# Patient Record
Sex: Male | Born: 1970
Health system: Southern US, Community
[De-identification: ages and names within clinical notes are randomized; demographics above are authoritative.]

## PROBLEM LIST (undated history)

## (undated) DIAGNOSIS — M109 Gout, unspecified: Secondary | ICD-10-CM

## (undated) DIAGNOSIS — G4730 Sleep apnea, unspecified: Secondary | ICD-10-CM

## (undated) DIAGNOSIS — Z9889 Other specified postprocedural states: Secondary | ICD-10-CM

## (undated) DIAGNOSIS — R112 Nausea with vomiting, unspecified: Secondary | ICD-10-CM

## (undated) HISTORY — PX: KNEE SURGERY: SHX244

## (undated) HISTORY — PX: TONSILLECTOMY: SUR1361

## (undated) HISTORY — PX: EYE SURGERY: SHX253

---

## 2001-09-26 ENCOUNTER — Emergency Department (HOSPITAL_COMMUNITY): Admission: EM | Admit: 2001-09-26 | Discharge: 2001-09-26 | Payer: Self-pay | Admitting: Emergency Medicine

## 2001-09-26 ENCOUNTER — Encounter: Payer: Self-pay | Admitting: Emergency Medicine

## 2015-06-09 ENCOUNTER — Ambulatory Visit: Payer: Self-pay | Admitting: General Surgery

## 2015-06-28 ENCOUNTER — Encounter (HOSPITAL_COMMUNITY): Payer: Self-pay | Admitting: Emergency Medicine

## 2015-06-28 DIAGNOSIS — K8050 Calculus of bile duct without cholangitis or cholecystitis without obstruction: Secondary | ICD-10-CM | POA: Insufficient documentation

## 2015-06-28 DIAGNOSIS — R630 Anorexia: Secondary | ICD-10-CM | POA: Insufficient documentation

## 2015-06-28 DIAGNOSIS — R1013 Epigastric pain: Secondary | ICD-10-CM | POA: Diagnosis present

## 2015-06-28 NOTE — ED Notes (Signed)
Pt. reports epigastric pain with nausea onset last night , denies emesis or diarrhea . No fever or chills. Pt. stated history of gallstones.

## 2015-06-29 ENCOUNTER — Emergency Department (HOSPITAL_COMMUNITY): Payer: BLUE CROSS/BLUE SHIELD

## 2015-06-29 ENCOUNTER — Emergency Department (HOSPITAL_COMMUNITY)
Admission: EM | Admit: 2015-06-29 | Discharge: 2015-06-29 | Disposition: A | Discharge status: Home / Self Care | Payer: BLUE CROSS/BLUE SHIELD | Attending: Emergency Medicine | Admitting: Emergency Medicine

## 2015-06-29 DIAGNOSIS — K8050 Calculus of bile duct without cholangitis or cholecystitis without obstruction: Secondary | ICD-10-CM

## 2015-06-29 LAB — COMPREHENSIVE METABOLIC PANEL
ALT: 19 U/L (ref 17–63)
AST: 23 U/L (ref 15–41)
Albumin: 4 g/dL (ref 3.5–5.0)
Alkaline Phosphatase: 58 U/L (ref 38–126)
Anion gap: 10 (ref 5–15)
BUN: 15 mg/dL (ref 6–20)
CO2: 23 mmol/L (ref 22–32)
Calcium: 9.2 mg/dL (ref 8.9–10.3)
Chloride: 104 mmol/L (ref 101–111)
Creatinine, Ser: 1.06 mg/dL (ref 0.61–1.24)
GFR calc Af Amer: >60 mL/min (ref >60)
GFR calc non Af Amer: >60 mL/min (ref >60)
Glucose, Bld: 111 mg/dL — ABNORMAL HIGH (ref 65–99)
Potassium: 4.2 mmol/L (ref 3.5–5.1)
Sodium: 137 mmol/L (ref 135–145)
Total Bilirubin: 0.9 mg/dL (ref 0.3–1.2)
Total Protein: 7.2 g/dL (ref 6.5–8.1)

## 2015-06-29 LAB — URINALYSIS, ROUTINE W REFLEX MICROSCOPIC
Bilirubin Urine: NEGATIVE
Glucose, UA: NEGATIVE mg/dL
Hgb urine dipstick: NEGATIVE
Ketones, ur: NEGATIVE mg/dL
Leukocytes, UA: NEGATIVE
Nitrite: NEGATIVE
Protein, ur: NEGATIVE mg/dL
Specific Gravity, Urine: 1.026 (ref 1.005–1.030)
Urobilinogen, UA: 1 mg/dL (ref 0.0–1.0)
pH: 5.5 (ref 5.0–8.0)

## 2015-06-29 LAB — CBC
HCT: 41.7 % (ref 39.0–52.0)
Hemoglobin: 14.5 g/dL (ref 13.0–17.0)
MCH: 31.3 pg (ref 26.0–34.0)
MCHC: 34.8 g/dL (ref 30.0–36.0)
MCV: 90.1 fL (ref 78.0–100.0)
Platelets: 201 10*3/uL (ref 150–400)
RBC: 4.63 MIL/uL (ref 4.22–5.81)
RDW: 12.4 % (ref 11.5–15.5)
WBC: 10.4 10*3/uL (ref 4.0–10.5)

## 2015-06-29 LAB — LIPASE, BLOOD: Lipase: 24 U/L (ref 22–51)

## 2015-06-29 MED ORDER — ONDANSETRON 4 MG PO TBDP: ORAL_TABLET | ORAL | Status: DC

## 2015-06-29 MED ORDER — ONDANSETRON HCL 4 MG/2ML IJ SOLN
4.0000 mg | Freq: Once | INTRAMUSCULAR | Status: DC
  Filled 2015-06-29: qty 2

## 2015-06-29 MED ORDER — HYDROMORPHONE HCL 1 MG/ML IJ SOLN: 1.0000 mg | Freq: Once | INTRAMUSCULAR | Status: DC

## 2015-06-29 NOTE — ED Notes (Signed)
The pt is scheduled for gb surgery

## 2015-06-29 NOTE — Discharge Instructions (Signed)
Biliary Colic  °Biliary colic is a steady or irregular pain in the upper abdomen. It is usually under the right side of the rib cage. It happens when gallstones interfere with the normal flow of bile from the gallbladder. Bile is a liquid that helps to digest fats. Bile is made in the liver and stored in the gallbladder. When you eat a meal, bile passes from the gallbladder through the cystic duct and the common bile duct into the small intestine. There, it mixes with partially digested food. If a gallstone blocks either of these ducts, the normal flow of bile is blocked. The muscle cells in the bile duct contract forcefully to try to move the stone. This causes the pain of biliary colic.  °SYMPTOMS  °· A person with biliary colic usually complains of pain in the upper abdomen. This pain can be: °¨ In the center of the upper abdomen just below the breastbone. °¨ In the upper-right part of the abdomen, near the gallbladder and liver. °¨ Spread back toward the right shoulder blade. °· Nausea and vomiting. °· The pain usually occurs after eating. °· Biliary colic is usually triggered by the digestive system's demand for bile. The demand for bile is high after fatty meals. Symptoms can also occur when a person who has been fasting suddenly eats a very large meal. Most episodes of biliary colic pass after 1 to 5 hours. After the most intense pain passes, your abdomen may continue to ache mildly for about 24 hours. °DIAGNOSIS  °After you describe your symptoms, your caregiver will perform a physical exam. He or she will pay attention to the upper right portion of your belly (abdomen). This is the area of your liver and gallbladder. An ultrasound will help your caregiver look for gallstones. Specialized scans of the gallbladder may also be done. Blood tests may be done, especially if you have fever or if your pain persists. °PREVENTION  °Biliary colic can be prevented by controlling the risk factors for gallstones. Some of  these risk factors, such as heredity, increasing age, and pregnancy are a normal part of life. Obesity and a high-fat diet are risk factors you can change through a healthy lifestyle. Women going through menopause who take hormone replacement therapy (estrogen) are also more likely to develop biliary colic. °TREATMENT  °· Pain medication may be prescribed. °· You may be encouraged to eat a fat-free diet. °· If the first episode of biliary colic is severe, or episodes of colic keep retuning, surgery to remove the gallbladder (cholecystectomy) is usually recommended. This procedure can be done through small incisions using an instrument called a laparoscope. The procedure often requires a brief stay in the hospital. Some people can leave the hospital the same day. It is the most widely used treatment in people troubled by painful gallstones. It is effective and safe, with no complications in more than 90% of cases. °· If surgery cannot be done, medication that dissolves gallstones may be used. This medication is expensive and can take months or years to work. Only small stones will dissolve. °· Rarely, medication to dissolve gallstones is combined with a procedure called shock-wave lithotripsy. This procedure uses carefully aimed shock waves to break up gallstones. In many people treated with this procedure, gallstones form again within a few years. °PROGNOSIS  °If gallstones block your cystic duct or common bile duct, you are at risk for repeated episodes of biliary colic. There is also a 25% chance that you will develop   a gallbladder infection(acute cholecystitis), or some other complication of gallstones within 10 to 20 years. If you have surgery, schedule it at a time that is convenient for you and at a time when you are not sick. °HOME CARE INSTRUCTIONS  °· Drink plenty of clear fluids. °· Avoid fatty, greasy or fried foods, or any foods that make your pain worse. °· Take medications as directed. °SEEK MEDICAL  CARE IF:  °· You develop a fever over 100.5° F (38.1° C). °· Your pain gets worse over time. °· You develop nausea that prevents you from eating and drinking. °· You develop vomiting. °SEEK IMMEDIATE MEDICAL CARE IF:  °· You have continuous or severe belly (abdominal) pain which is not relieved with medications. °· You develop nausea and vomiting which is not relieved with medications. °· You have symptoms of biliary colic and you suddenly develop a fever and shaking chills. This may signal cholecystitis. Call your caregiver immediately. °· You develop a yellow color to your skin or the white part of your eyes (jaundice). °Document Released: 04/17/2006 Document Revised: 02/06/2012 Document Reviewed: 06/26/2008 °Exit[employer]® Patient Information ©2015 [employer]. This information is not intended to replace advice given to you by your health care provider. Make sure you discuss any questions you have with your health care provider. ° °

## 2015-06-29 NOTE — ED Notes (Signed)
abd pain with nv.  Has gallstones

## 2015-06-29 NOTE — ED Notes (Signed)
To ct

## 2015-06-29 NOTE — ED Provider Notes (Signed)
CSN: [id number]     Arrival date & time 06/28/15  2339 History  This chart was scribed for Mirian Mo, MD by Doreatha Martin, ED Scribe. This patient was seen in room A13C/A13C and the patient's care was started at 1:23 AM.     Chief Complaint  Patient presents with  . Abdominal Pain   Patient is a 44 y.o. male presenting with abdominal pain. The history is provided by the patient. No language interpreter was used.  Abdominal Pain Pain location:  Epigastric Pain radiates to:  Does not radiate Pain severity:  Moderate Duration:  22 hours Timing:  Constant Chronicity:  Recurrent Relieved by:  OTC medications Worsened by:  Movement Associated symptoms: nausea   Associated symptoms: no chills, no constipation, no diarrhea, no fever and no vomiting     HPI Comments: Gabriel Carlson is a 44 y.o. male who presents to the Emergency Department complaining of moderate epigastric abdominal pain onset at 0300 today. Pt reports associated nausea. Per pt, pain was slightly relieved by lying flat. He states that he has not eaten today. He states similar pain on June 19 and the episode lasted for 4-6 hours with 50-100 episodes of vomiting. He went to PCP for testing and was diagnosed with gallstones. The wife notes that they have come tonight because the surgeon they saw stated that his case was non-emergent and would not remove his gallbladder at that time. Pt took pain medication 8 hours ago with moderate relief. He denies vomiting, fever, chills, constipation and diarrhea.   History reviewed. No pertinent past medical history. Past Surgical History  Procedure Laterality Date  . Eye surgery    . Tonsillectomy    . Knee surgery     No family history on file. History  Substance Use Topics  . Smoking status: Never Smoker   . Smokeless tobacco: Not on file  . Alcohol Use: No    Review of Systems  Constitutional: Negative for fever and chills.  Gastrointestinal: Positive for nausea and abdominal  pain. Negative for vomiting, diarrhea and constipation.  All other systems reviewed and are negative.  Allergies  Review of patient's allergies indicates no known allergies.  Home Medications   Prior to Admission medications   Medication Sig Start Date End Date Taking? Authorizing Provider  ondansetron (ZOFRAN ODT) 4 MG disintegrating tablet  ODT q4 hours prn nausea/vomit 06/29/15   Mirian Mo, MD   BP 120/58 mmHg  Pulse 71  Temp(Src) 97.7 F (36.5 C) (Oral)  Resp 14  Ht 6' (1.829 m)  Wt 240 lb (108.863 kg)  BMI 32.54 kg/m2  SpO2 100% Physical Exam  Constitutional: He is oriented to person, place, and time. He appears well-developed and well-nourished.  HENT:  Head: Normocephalic and atraumatic.  Eyes: Conjunctivae and EOM are normal.  Neck: Normal range of motion. Neck supple.  Cardiovascular: Normal rate, regular rhythm and normal heart sounds.   Pulmonary/Chest: Effort normal and breath sounds normal. No respiratory distress.  Abdominal: He exhibits no distension. There is tenderness in the right upper quadrant and epigastric area. There is no rebound and no guarding.  Musculoskeletal: Normal range of motion.  Neurological: He is alert and oriented to person, place, and time.  Skin: Skin is warm and dry.  Vitals reviewed.   ED Course  Procedures (including critical care time) DIAGNOSTIC STUDIES: Oxygen Saturation is 100% on RA, normal by my interpretation.    COORDINATION OF CARE: 1:32 AM Discussed treatment plan with pt at  bedside and pt agreed to plan.   Labs Review Labs Reviewed  COMPREHENSIVE METABOLIC PANEL - Abnormal; Notable for the following:    Glucose, Bld 111 (*)    All other components within normal limits  LIPASE, BLOOD  CBC  URINALYSIS, ROUTINE W REFLEX MICROSCOPIC (NOT AT [facility])    Imaging Review No results found.   EKG Interpretation None      MDM   Final diagnoses:  Biliary colic    44 y.o. male with pertinent PMH of biliary  colic with prior planned cholecystectomy (not currently scheduled) presents with recurrent abd pain.  Patient denies fever, altered mental status, other signs of cholangitis. On arrival his vital signs and physical exam as above. He has a negative Murphy sign on my exam, but does have mild RUQ tenderness. Workup obtained as above and demonstrated questionable cholecystitis.  I spoke with surgery to his reviewed the ultrasound, feel the pt stable for dc home as he has no fever, no leukocytosis, and is otherwise well.  I discussed the results with the pt and reviewed strict return precautions.  DC home to fu with surgery.    I have reviewed all laboratory and imaging studies if ordered as above  1. Biliary colic          Mirian Mo, MD 07/02/15 [phone]

## 2015-07-08 ENCOUNTER — Encounter (HOSPITAL_COMMUNITY): Payer: Self-pay

## 2015-07-08 ENCOUNTER — Inpatient Hospital Stay (HOSPITAL_COMMUNITY)
Admission: RE | Admit: 2015-07-08 | Discharge: 2015-07-08 | Disposition: A | Discharge status: Home / Self Care | Payer: BLUE CROSS/BLUE SHIELD | Source: Ambulatory Visit

## 2015-07-08 HISTORY — DX: Sleep apnea, unspecified: G47.30

## 2015-07-08 HISTORY — DX: Gout, unspecified: M10.9

## 2015-07-09 MED ORDER — CHLORHEXIDINE GLUCONATE 4 % EX LIQD: 1.0000 "application " | CUTANEOUS | Status: DC

## 2015-07-09 MED ORDER — DEXTROSE 5 % IV SOLN
2.0000 g | INTRAVENOUS | Status: AC
  Administered 2015-07-10: 2 g via INTRAVENOUS
  Filled 2015-07-09: qty 2

## 2015-07-10 ENCOUNTER — Ambulatory Visit (HOSPITAL_COMMUNITY): Payer: BLUE CROSS/BLUE SHIELD | Admitting: Anesthesiology

## 2015-07-10 ENCOUNTER — Ambulatory Visit (HOSPITAL_COMMUNITY)
Admission: RE | Admit: 2015-07-10 | Discharge: 2015-07-10 | Disposition: A | Discharge status: Home / Self Care | Payer: BLUE CROSS/BLUE SHIELD | Source: Ambulatory Visit | Attending: General Surgery | Admitting: General Surgery

## 2015-07-10 ENCOUNTER — Encounter (HOSPITAL_COMMUNITY)
Admission: RE | Disposition: A | Discharge status: Home / Self Care | Payer: Self-pay | Source: Ambulatory Visit | Attending: General Surgery

## 2015-07-10 ENCOUNTER — Encounter (HOSPITAL_COMMUNITY): Payer: Self-pay | Admitting: *Deleted

## 2015-07-10 DIAGNOSIS — K8020 Calculus of gallbladder without cholecystitis without obstruction: Secondary | ICD-10-CM | POA: Insufficient documentation

## 2015-07-10 DIAGNOSIS — G4730 Sleep apnea, unspecified: Secondary | ICD-10-CM | POA: Insufficient documentation

## 2015-07-10 HISTORY — PX: CHOLECYSTECTOMY: SHX55

## 2015-07-10 HISTORY — DX: Nausea with vomiting, unspecified: R11.2

## 2015-07-10 HISTORY — DX: Other specified postprocedural states: Z98.890

## 2015-07-10 SURGERY — LAPAROSCOPIC CHOLECYSTECTOMY WITH INTRAOPERATIVE CHOLANGIOGRAM
Anesthesia: General | Site: Abdomen

## 2015-07-10 MED ORDER — MIDAZOLAM HCL 2 MG/2ML IJ SOLN
INTRAMUSCULAR | Status: AC
  Filled 2015-07-10: qty 4

## 2015-07-10 MED ORDER — BUPIVACAINE-EPINEPHRINE (PF) 0.25% -1:200000 IJ SOLN
INTRAMUSCULAR | Status: AC
  Filled 2015-07-10: qty 30

## 2015-07-10 MED ORDER — NEOSTIGMINE METHYLSULFATE 10 MG/10ML IV SOLN
INTRAVENOUS | Status: DC | PRN
  Administered 2015-07-10: 3 mg via INTRAVENOUS

## 2015-07-10 MED ORDER — FENTANYL CITRATE (PF) 100 MCG/2ML IJ SOLN
INTRAMUSCULAR | Status: DC | PRN
  Administered 2015-07-10: 50 ug via INTRAVENOUS
  Administered 2015-07-10 (×2): 100 ug via INTRAVENOUS

## 2015-07-10 MED ORDER — SODIUM CHLORIDE 0.9 % IR SOLN
Status: DC | PRN
  Administered 2015-07-10: 1000 mL

## 2015-07-10 MED ORDER — LIDOCAINE HCL (CARDIAC) 20 MG/ML IV SOLN
INTRAVENOUS | Status: DC | PRN
  Administered 2015-07-10: 100 mg via INTRAVENOUS

## 2015-07-10 MED ORDER — HYDROMORPHONE HCL 1 MG/ML IJ SOLN
0.2500 mg | INTRAMUSCULAR | Status: DC | PRN
  Administered 2015-07-10: 0.5 mg via INTRAVENOUS

## 2015-07-10 MED ORDER — MIDAZOLAM HCL 5 MG/5ML IJ SOLN
INTRAMUSCULAR | Status: DC | PRN
  Administered 2015-07-10: 2 mg via INTRAVENOUS

## 2015-07-10 MED ORDER — GLYCOPYRROLATE 0.2 MG/ML IJ SOLN
INTRAMUSCULAR | Status: DC | PRN
  Administered 2015-07-10: 0.4 mg via INTRAVENOUS

## 2015-07-10 MED ORDER — SCOPOLAMINE 1 MG/3DAYS TD PT72
1.0000 | MEDICATED_PATCH | TRANSDERMAL | Status: DC
  Administered 2015-07-10: 1.5 mg via TRANSDERMAL
  Filled 2015-07-10: qty 1

## 2015-07-10 MED ORDER — SCOPOLAMINE 1 MG/3DAYS TD PT72: 1.0000 | MEDICATED_PATCH | Freq: Once | TRANSDERMAL | Status: DC

## 2015-07-10 MED ORDER — ONDANSETRON HCL 4 MG/2ML IJ SOLN
INTRAMUSCULAR | Status: DC | PRN
  Administered 2015-07-10: 4 mg via INTRAVENOUS

## 2015-07-10 MED ORDER — HYDROCODONE-ACETAMINOPHEN 5-325 MG PO TABS: 1.0000 | ORAL_TABLET | ORAL | Status: AC | PRN

## 2015-07-10 MED ORDER — FENTANYL CITRATE (PF) 250 MCG/5ML IJ SOLN
INTRAMUSCULAR | Status: AC
  Filled 2015-07-10: qty 5

## 2015-07-10 MED ORDER — PROMETHAZINE HCL 25 MG/ML IJ SOLN
6.2500 mg | INTRAMUSCULAR | Status: DC | PRN
  Administered 2015-07-10: 6.25 mg via INTRAVENOUS

## 2015-07-10 MED ORDER — HYDROMORPHONE HCL 1 MG/ML IJ SOLN
INTRAMUSCULAR | Status: AC
  Filled 2015-07-10: qty 1

## 2015-07-10 MED ORDER — 0.9 % SODIUM CHLORIDE (POUR BTL) OPTIME
TOPICAL | Status: DC | PRN
  Administered 2015-07-10: 1000 mL

## 2015-07-10 MED ORDER — PROPOFOL 10 MG/ML IV BOLUS
INTRAVENOUS | Status: AC
  Filled 2015-07-10: qty 20

## 2015-07-10 MED ORDER — METOCLOPRAMIDE HCL 5 MG/ML IJ SOLN
INTRAMUSCULAR | Status: AC
  Filled 2015-07-10: qty 2

## 2015-07-10 MED ORDER — ROCURONIUM BROMIDE 100 MG/10ML IV SOLN
INTRAVENOUS | Status: DC | PRN
  Administered 2015-07-10: 20 mg via INTRAVENOUS
  Administered 2015-07-10: 30 mg via INTRAVENOUS

## 2015-07-10 MED ORDER — ONDANSETRON HCL 4 MG/2ML IJ SOLN
INTRAMUSCULAR | Status: AC
Start: 2015-07-10 — End: 2015-07-10
  Administered 2015-07-10: 4 mg
  Filled 2015-07-10: qty 2

## 2015-07-10 MED ORDER — BUPIVACAINE-EPINEPHRINE 0.25% -1:200000 IJ SOLN
INTRAMUSCULAR | Status: DC | PRN
  Administered 2015-07-10: 15 mL

## 2015-07-10 MED ORDER — METOCLOPRAMIDE HCL 5 MG/ML IJ SOLN
INTRAMUSCULAR | Status: DC | PRN
  Administered 2015-07-10: 10 mg via INTRAVENOUS

## 2015-07-10 MED ORDER — LACTATED RINGERS IV SOLN
INTRAVENOUS | Status: DC
  Administered 2015-07-10 (×3): via INTRAVENOUS

## 2015-07-10 MED ORDER — SCOPOLAMINE 1 MG/3DAYS TD PT72: 1.0000 | MEDICATED_PATCH | TRANSDERMAL | Status: AC

## 2015-07-10 MED ORDER — PROPOFOL 10 MG/ML IV BOLUS
INTRAVENOUS | Status: DC | PRN
  Administered 2015-07-10: 150 mg via INTRAVENOUS

## 2015-07-10 MED ORDER — PROMETHAZINE HCL 25 MG/ML IJ SOLN
INTRAMUSCULAR | Status: AC
Start: 2015-07-10 — End: 2015-07-10
  Administered 2015-07-10: 6.25 mg via INTRAVENOUS
  Filled 2015-07-10: qty 1

## 2015-07-10 MED ORDER — SCOPOLAMINE 1 MG/3DAYS TD PT72
MEDICATED_PATCH | TRANSDERMAL | Status: AC
  Filled 2015-07-10: qty 1

## 2015-07-10 SURGICAL SUPPLY — 44 items
APPLIER CLIP 5 13 M/L LIGAMAX5 (MISCELLANEOUS) ×3
BLADE SURG ROTATE 9660 (MISCELLANEOUS) ×3 IMPLANT
CANISTER SUCTION 2500CC (MISCELLANEOUS) ×3 IMPLANT
CHLORAPREP W/TINT 26ML (MISCELLANEOUS) ×3 IMPLANT
CLIP APPLIE 5 13 M/L LIGAMAX5 (MISCELLANEOUS) ×1 IMPLANT
CLOSURE WOUND 1/2 X4 (GAUZE/BANDAGES/DRESSINGS) ×1
CONT SPEC 4OZ CLIKSEAL STRL BL (MISCELLANEOUS) ×3 IMPLANT
COVER MAYO STAND STRL (DRAPES) IMPLANT
COVER SURGICAL LIGHT HANDLE (MISCELLANEOUS) ×3 IMPLANT
DERMABOND ADVANCED (GAUZE/BANDAGES/DRESSINGS) ×2
DERMABOND ADVANCED .7 DNX12 (GAUZE/BANDAGES/DRESSINGS) ×1 IMPLANT
DRAPE C-ARM 42X72 X-RAY (DRAPES) IMPLANT
DRSG TEGADERM 2-3/8X2-3/4 SM (GAUZE/BANDAGES/DRESSINGS) ×3 IMPLANT
ELECT REM PT RETURN 9FT ADLT (ELECTROSURGICAL) ×3
ELECTRODE REM PT RTRN 9FT ADLT (ELECTROSURGICAL) ×1 IMPLANT
GLOVE BIO SURGEON STRL SZ 6.5 (GLOVE) ×2 IMPLANT
GLOVE BIO SURGEONS STRL SZ 6.5 (GLOVE) ×1
GLOVE BIOGEL PI IND STRL 7.0 (GLOVE) ×1 IMPLANT
GLOVE BIOGEL PI IND STRL 8 (GLOVE) ×1 IMPLANT
GLOVE BIOGEL PI INDICATOR 7.0 (GLOVE) ×2
GLOVE BIOGEL PI INDICATOR 8 (GLOVE) ×2
GLOVE ECLIPSE 7.5 STRL STRAW (GLOVE) ×3 IMPLANT
GOWN STRL REUS W/ TWL LRG LVL3 (GOWN DISPOSABLE) ×2 IMPLANT
GOWN STRL REUS W/TWL 2XL LVL3 (GOWN DISPOSABLE) ×3 IMPLANT
GOWN STRL REUS W/TWL LRG LVL3 (GOWN DISPOSABLE) ×4
KIT BASIN OR (CUSTOM PROCEDURE TRAY) ×3 IMPLANT
KIT ROOM TURNOVER OR (KITS) ×3 IMPLANT
NS IRRIG 1000ML POUR BTL (IV SOLUTION) ×3 IMPLANT
PAD ARMBOARD 7.5X6 YLW CONV (MISCELLANEOUS) ×3 IMPLANT
POUCH SPECIMEN RETRIEVAL 10MM (ENDOMECHANICALS) ×3 IMPLANT
SCISSORS LAP 5X35 DISP (ENDOMECHANICALS) ×3 IMPLANT
SET CHOLANGIOGRAPH 5 50 .035 (SET/KITS/TRAYS/PACK) IMPLANT
SET IRRIG TUBING LAPAROSCOPIC (IRRIGATION / IRRIGATOR) ×3 IMPLANT
SLEEVE ENDOPATH XCEL 5M (ENDOMECHANICALS) ×6 IMPLANT
SPECIMEN JAR SMALL (MISCELLANEOUS) IMPLANT
STRIP CLOSURE SKIN 1/2X4 (GAUZE/BANDAGES/DRESSINGS) ×2 IMPLANT
SUT MNCRL AB 4-0 PS2 18 (SUTURE) ×6 IMPLANT
SUT VICRYL 0 UR6 27IN ABS (SUTURE) ×3 IMPLANT
TOWEL OR 17X24 6PK STRL BLUE (TOWEL DISPOSABLE) ×3 IMPLANT
TOWEL OR 17X26 10 PK STRL BLUE (TOWEL DISPOSABLE) ×3 IMPLANT
TRAY LAPAROSCOPIC MC (CUSTOM PROCEDURE TRAY) ×3 IMPLANT
TROCAR XCEL BLUNT TIP 100MML (ENDOMECHANICALS) ×3 IMPLANT
TROCAR XCEL NON-BLD 5MMX100MML (ENDOMECHANICALS) ×3 IMPLANT
TUBING INSUFFLATION (TUBING) ×3 IMPLANT

## 2015-07-10 NOTE — Anesthesia Postprocedure Evaluation (Signed)
  Anesthesia Post-op Note  Patient: Gabriel Carlson  Procedure(s) Performed: Procedure(s): LAPAROSCOPIC CHOLECYSTECTOMY  (N/A)  Patient Location: PACU  Anesthesia Type:General  Level of Consciousness: awake, alert , oriented and patient cooperative  Airway and Oxygen Therapy: Patient Spontanous Breathing  Post-op Pain: mild  Post-op Assessment: Post-op Vital signs reviewed, Patient's Cardiovascular Status Stable, Respiratory Function Stable, Patent Airway, No signs of Nausea or vomiting and Pain level controlled              Post-op Vital Signs: Reviewed and stable  Last Vitals:  Filed Vitals:   07/10/15 1725  BP: 137/82  Pulse:   Temp: 36.8 C  Resp: 16    Complications: No apparent anesthesia complications

## 2015-07-10 NOTE — Transfer of Care (Signed)
Immediate Anesthesia Transfer of Care Note  Patient: Gabriel Carlson  Procedure(s) Performed: Procedure(s): LAPAROSCOPIC CHOLECYSTECTOMY  (N/A)  Patient Location: PACU  Anesthesia Type:General  Level of Consciousness: alert   Airway & Oxygen Therapy: Patient Spontanous Breathing and Patient connected to nasal cannula oxygen  Post-op Assessment: Report given to RN and Post -op Vital signs reviewed and stable  Post vital signs: Reviewed and stable  Last Vitals:  Filed Vitals:   07/10/15 1725  BP: 137/82  Pulse: 74  Temp: 36.8 C  Resp: 16    Complications: No apparent anesthesia complications

## 2015-07-10 NOTE — Anesthesia Preprocedure Evaluation (Addendum)
Anesthesia Evaluation  Patient identified by MRN, date of birth, ID band Patient awake    Reviewed: Allergy & Precautions, NPO status , Patient's Chart, lab work & pertinent test results  History of Anesthesia Complications (+) PONV and history of anesthetic complications  Airway Mallampati: II  TM Distance: >3 FB Neck ROM: Full    Dental  (+) Teeth Intact, Dental Advisory Given   Pulmonary sleep apnea ,  breath sounds clear to auscultation  Pulmonary exam normal       Cardiovascular Exercise Tolerance: Good negative cardio ROS Normal cardiovascular examRhythm:Regular Rate:Normal     Neuro/Psych negative neurological ROS  negative psych ROS   GI/Hepatic negative GI ROS, Neg liver ROS,   Endo/Other  obesity  Renal/GU negative Renal ROS     Musculoskeletal negative musculoskeletal ROS (+)   Abdominal   Peds  Hematology negative hematology ROS (+)   Anesthesia Other Findings Day of surgery medications reviewed with the patient.  Reproductive/Obstetrics                            Anesthesia Physical Anesthesia Plan  ASA: II  Anesthesia Plan: General   Post-op Pain Management:    Induction: Intravenous  Airway Management Planned: Oral ETT  Additional Equipment:   Intra-op Plan:   Post-operative Plan: Extubation in OR  Informed Consent: I have reviewed the patients History and Physical, chart, labs and discussed the procedure including the risks, benefits and alternatives for the proposed anesthesia with the patient or authorized representative who has indicated his/her understanding and acceptance.   Dental advisory given  Plan Discussed with: CRNA  Anesthesia Plan Comments: (Risks/benefits of general anesthesia discussed with patient including risk of damage to teeth, lips, gum, and tongue, nausea/vomiting, allergic reactions to medications, and the possibility of heart  attack, stroke and death.  All patient questions answered.  Patient wishes to proceed.)        Anesthesia Quick Evaluation

## 2015-07-10 NOTE — H&P (Signed)
Gabriel Carlson 06/09/2015 9:14 AM Location: [facility] Patient #: [id number] DOB: 1971-02-17 Married / Language: English / Race: White Male  History of Present Illness Gabriel Carlson CMA; 06/09/2015 9:14 AM) Patient words: gallbladder.  The patient is a 44 year old male    Other Problems Gabriel Carlson, [place]; 06/09/2015 9:14 AM) Arthritis Back Pain Chest pain Cholelithiasis Gastroesophageal Reflux Disease Kidney Stone Sleep Apnea  Past Surgical History Gabriel Carlson, CMA; 06/09/2015 9:14 AM) Knee Surgery Left. Tonsillectomy  Diagnostic Studies History Gabriel Carlson, [place]; 06/09/2015 9:14 AM) Colonoscopy never  Allergies Gabriel Carlson, [place]; 06/09/2015 9:14 AM) No Known Drug Allergies07/10/2015  Medication History Gabriel Carlson, [place]; 06/09/2015 9:14 AM) No Current Medications  Social History Gabriel Carlson, [place]; 06/09/2015 9:14 AM) No alcohol use No caffeine use No drug use Tobacco use Never smoker.  Family History Gabriel Carlson, [place]; 06/09/2015 9:14 AM) Arthritis Father. Breast Cancer Mother. Heart disease in male family member before age 55 Heart disease in male family member before age 76 Hypertension Father. Migraine Headache Mother. Seizure disorder Sister.  Review of Systems [facility] R. Carlson CMA; 06/09/2015 9:14 AM) General Not Present- Appetite Loss, Chills, Fatigue, Fever, Night Sweats, Weight Gain and Weight Loss. Skin Present- Rash. Not Present- Change in Wart/Mole, Dryness, Hives, Jaundice, New Lesions, Non-Healing Wounds and Ulcer. HEENT Present- Wears glasses/contact lenses. Not Present- Earache, Hearing Loss, Hoarseness, Nose Bleed, Oral Ulcers, Ringing in the Ears, Seasonal Allergies, Sinus Pain, Sore Throat, Visual Disturbances and Yellow Eyes. Respiratory Not Present- Bloody sputum, Chronic Cough, Difficulty Breathing, Snoring and Wheezing. Breast Not Present- Breast Mass, Breast  Pain, Nipple Discharge and Skin Changes. Cardiovascular Not Present- Chest Pain, Difficulty Breathing Lying Down, Leg Cramps, Palpitations, Rapid Heart Rate, Shortness of Breath and Swelling of Extremities. Gastrointestinal Not Present- Abdominal Pain, Bloating, Bloody Stool, Change in Bowel Habits, Chronic diarrhea, Constipation, Difficulty Swallowing, Excessive gas, Gets full quickly at meals, Hemorrhoids, Indigestion, Nausea, Rectal Pain and Vomiting. Male Genitourinary Not Present- Blood in Urine, Change in Urinary Stream, Frequency, Impotence, Nocturia, Painful Urination, Urgency and Urine Leakage. Musculoskeletal Present- Back Pain and Joint Pain. Not Present- Joint Stiffness, Muscle Pain, Muscle Weakness and Swelling of Extremities. Neurological Not Present- Decreased Memory, Fainting, Headaches, Numbness, Seizures, Tingling, Tremor, Trouble walking and Weakness. Psychiatric Not Present- Anxiety, Bipolar, Change in Sleep Pattern, Depression, Fearful and Frequent crying. Endocrine Not Present- Cold Intolerance, Excessive Hunger, Hair Changes, Heat Intolerance, Hot flashes and New Diabetes. Hematology Not Present- Easy Bruising, Excessive bleeding, Gland problems, HIV and Persistent Infections.   Vitals [employer] R. Carlson CMA; 06/09/2015 9:14 AM) 06/09/2015 9:14 AM Weight: 237.5 lb Height: 72in Body Surface Area: 2.34 m Body Mass Index: 32.21 kg/m BP: 130/80 (Sitting, Left Arm, Standard)    Physical Exam (Kyanne Rials O. Lindie Spruce MD; 06/09/2015 10:09 AM) General Mental Status-Alert. General Appearance-Cooperative and Well groomed. Orientation-Oriented X4. Build & Nutrition-Muscular and Well developed.  Chest and Lung Exam Chest and lung exam reveals -normal excursion with symmetric chest walls, quiet, even and easy respiratory effort with no use of accessory muscles, non-tender and normal tactile fremitus and on auscultation, normal breath sounds, no adventitious sounds and  normal vocal resonance.  Cardiovascular Cardiovascular examination reveals -on palpation PMI is normal in location and amplitude, no palpable S3 or S4. Normal cardiac borders., normal heart sounds, regular rate and rhythm with no murmurs and (see Vital Signs section for blood pressure measurements).  Abdomen Inspection Inspection of the abdomen reveals - No Visible peristalsis.  Palpation/Percussion Palpation and Percussion of the abdomen reveal - Soft, Non Tender, No Rebound tenderness and No Rigidity (guarding). Note: Hairy abdomen. Auscultation Auscultation of the abdomen reveals - Bowel sounds normal.    Assessment & Plan [name] O. Lindie Spruce MD; 06/09/2015 10:11 AM) SYMPTOMATIC CHOLELITHIASIS (574.20  K80.20) Impression: First attack in June, Ultrasoudn demonstrates cholelithiasis. LFTs normal. Patient wants surgery in August sometime. Current Plans:  Lap chole with possible IOC.  Marta Lamas. Gae Bon, MD, FACS [phone] [facility]

## 2015-07-10 NOTE — Anesthesia Procedure Notes (Signed)
Procedure Name: Intubation Date/Time: 07/10/2015 4:04 PM Performed by: Gavin Pound, Inetta Dicke J Pre-anesthesia Checklist: Timeout performed, Patient identified, Emergency Drugs available, Suction available and Patient being monitored Patient Re-evaluated:Patient Re-evaluated prior to inductionOxygen Delivery Method: Circle system utilized Preoxygenation: Pre-oxygenation with 100% oxygen Intubation Type: IV induction Ventilation: Mask ventilation without difficulty Laryngoscope Size: Mac and 4 Grade View: Grade I Tube type: Oral Tube size: 7.5 mm Number of attempts: 1 Placement Confirmation: ETT inserted through vocal cords under direct vision and breath sounds checked- equal and bilateral Secured at: 22 cm Tube secured with: Tape Dental Injury: Teeth and Oropharynx as per pre-operative assessment

## 2015-07-10 NOTE — Op Note (Signed)
OPERATIVE REPORT  DATE OF OPERATION: 07/10/2015  PATIENT:  Gabriel Carlson  44 y.o. male  PRE-OPERATIVE DIAGNOSIS:  Symptomatic cholelithiasis  POST-OPERATIVE DIAGNOSIS:  Symptomatic cholelithiasis  PROCEDURE:  Procedure(s): LAPAROSCOPIC CHOLECYSTECTOMY   SURGEON:  Surgeon(s): Jimmye Norman, MD Romie Levee, MD  ASSISTANT: Romie Levee, MD  ANESTHESIA:   general  EBL: <20 ml  BLOOD ADMINISTERED: none  DRAINS: none   SPECIMEN:  Source of Specimen:  Gallbladder and contents  COUNTS CORRECT:  YES  PROCEDURE DETAILS: The patient was taken to the operating room and placed on the table in the supine position.  After an adequate endotracheal anesthetic was administered, the patient was prepped with ChloroPrep, and then draped in the usual manner exposing the entire abdomen laterally, inferiorly and up  to the costal margins.  After a proper timeout was performed including identifying the patient and the procedure to be performed, a supraumbilical 1.5cm midline incision was made using a #15 blade.  This was taken down to the fascia which was then incised with a #15 blade.  The edges of the fascia were tented up with Kocher clamps as the preperitoneal space was penetrated with a Kelly clamp into the peritoneum.  Once this was done, a pursestring suture of 0 Vicryl was passed around the fascial opening.  This was subsequently used to secure the [facility] cannula which was passed into the peritoneal cavity.  Once the [facility] cannula was in place, carbon dioxide gas was insufflated into the peritoneal cavity up to a maximal intra-abdominal pressure of 15mm Hg.The laparoscope, with attached camera and light source, was passed into the peritoneal cavity to visualize the direct insertion of two right upper quadrant 5mm cannulas, and a sup-xiphoid 5mm cannula.  Once all cannulas were in place, the dissection was begun.  Two ratcheted graspers were attached to the dome and infundibulum of the  gallbladder and retracted towards the anterior abdominal wall and the right upper quadrant.  Using cautery attached to a dissecting forceps, the peritoneum overlaying the triangle of Chalot and the hepatoduodenal triangle was dissected away exposing the cystic duct and the cystic artery.  A critical window was developed between the CBD and the cystic duct The cystic artery was clipped proximally and distally then transected.  A clip was placed on the gallbladder side of the cystic duct, then the distal cystic duct was clipped multiple times then transected between the clips.  The gallbladder was then dissected out of the hepatic bed without event.  It was retrieved from the abdomen (using an EndoCatch bag) without event.  Once the gallbladder was removed, the bed was inspected for hemostasis.  Once excellent hemostasis was obtained all gas and fluids were aspirated from above the liver, then the cannulas were removed.  The supraumbilical incision was closed using the pursestring suture which was in place.  0.25% bupivicaine with epinephrine was injected at all sites.  All 10mm or greater cannula sites were close using a running subcuticular stitch of 4-0 Monocryl.  5.76mm cannula sites were closed with Dermabond only.Steri-Strips and Tagaderm were used to complete the dressings at all sites.  At this point all needle, sponge, and instrument counts were correct.The patient was awakened from anesthesia and taken to the PACU in stable condition.   PATIENT DISPOSITION:  PACU - hemodynamically stable.   Irl Bodie 8/12/20165:23 PM

## 2015-07-13 ENCOUNTER — Encounter (HOSPITAL_COMMUNITY): Payer: Self-pay | Admitting: General Surgery

## 2016-12-15 IMAGING — US US ABDOMEN LIMITED
1 series · 14 of 25 positions shown · non-contrast
Comparison: Abdominal ultrasound 05/19/2015

CLINICAL DATA: Epigastric abdominal pain. Onset last night. Known
gallstones.

EXAM:
US ABDOMEN LIMITED - RIGHT UPPER QUADRANT

[Series 1: us abdomen limited · 0.25mm/px · 14 of 59 slices shown]
[im 1/59]
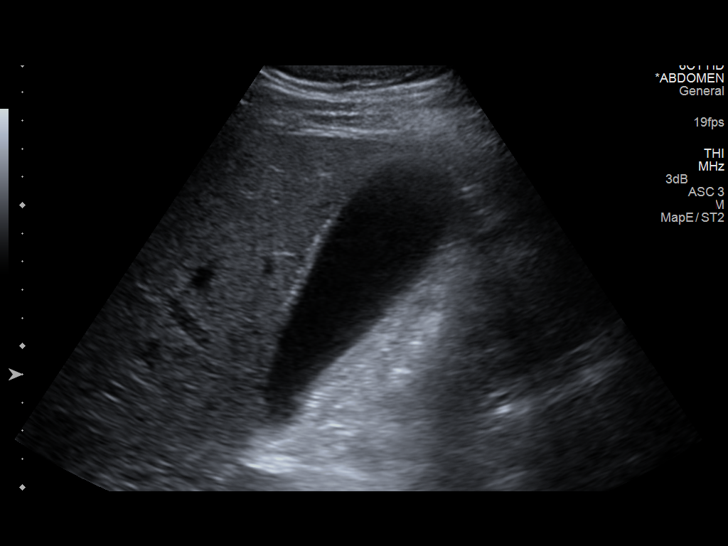
[im 5/59]
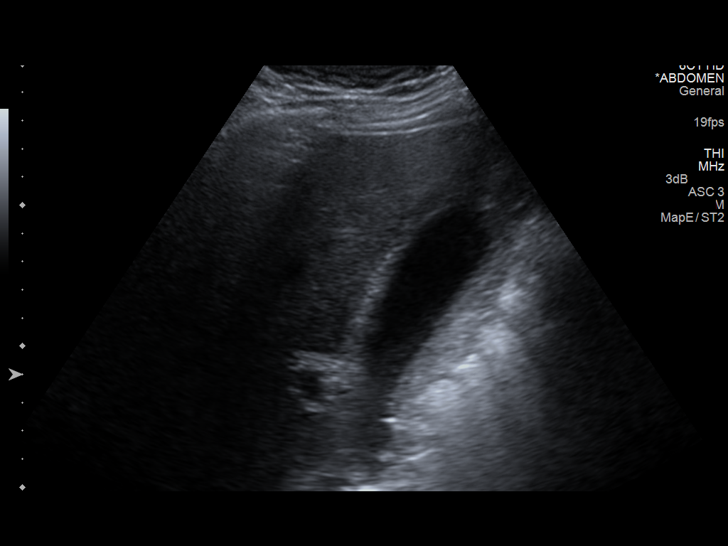
[im 10/59]
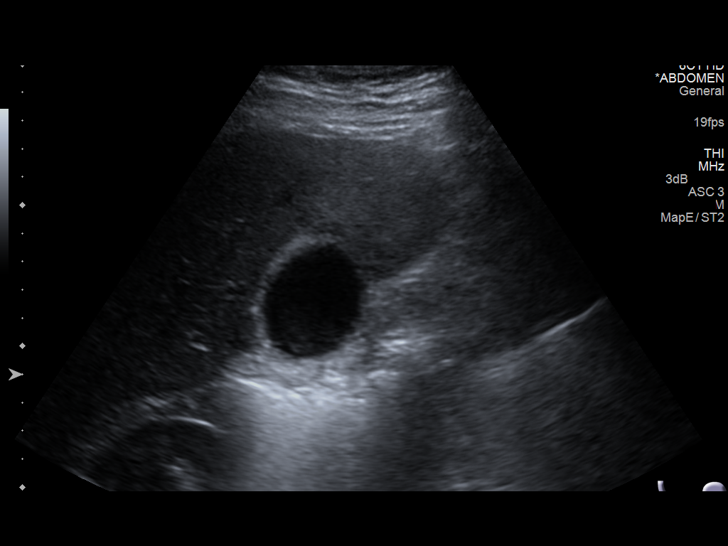
[im 15/59]
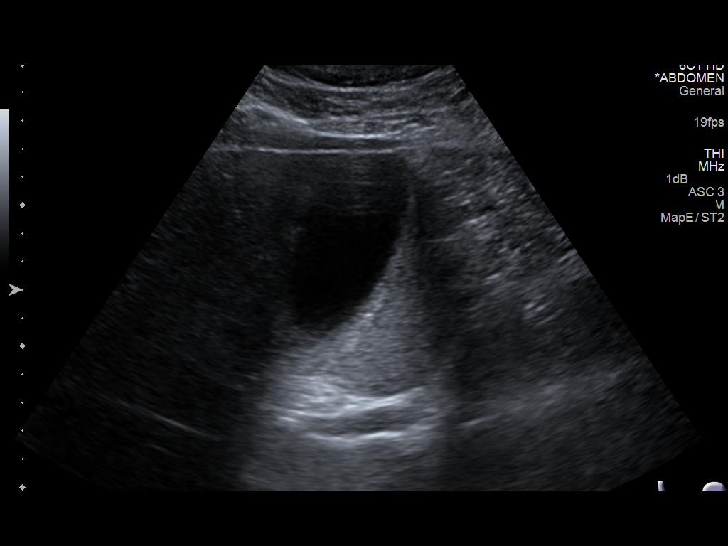
[im 20/59]
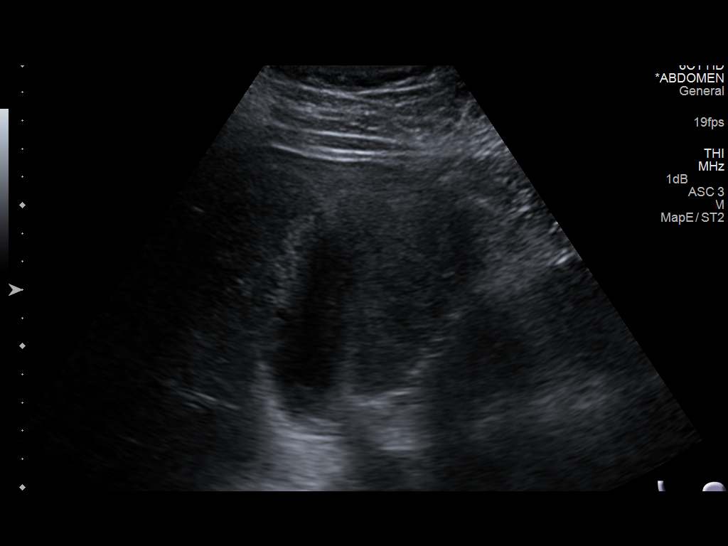
[im 22/59]
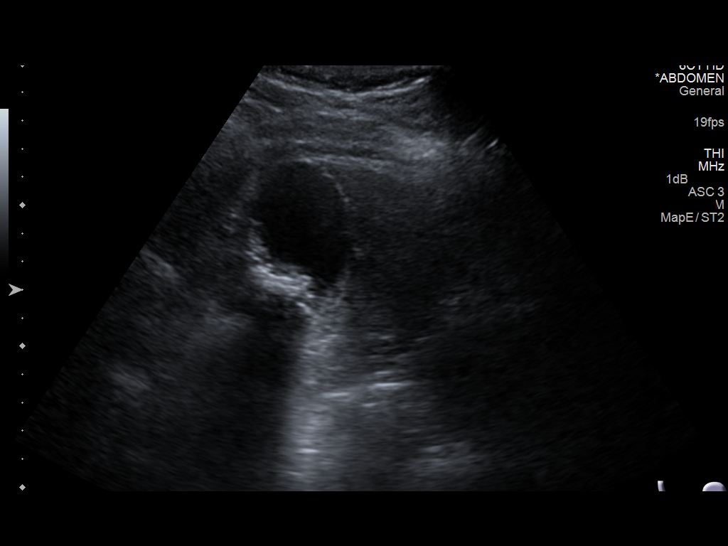
[im 27/59]
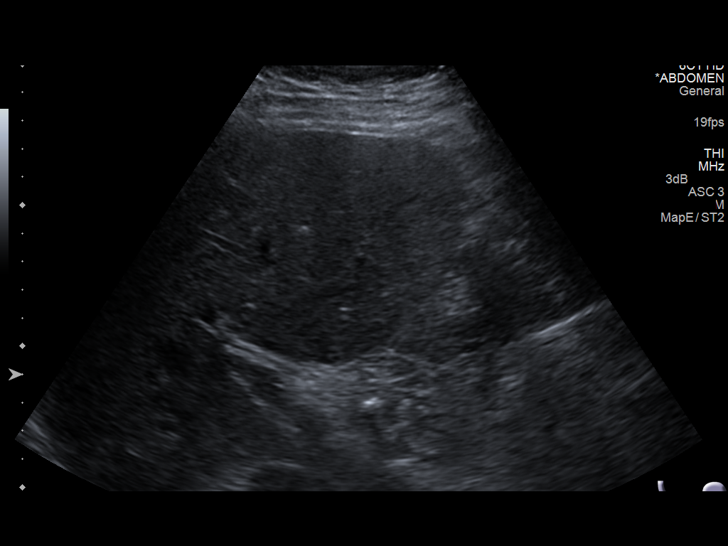
[im 32/59]
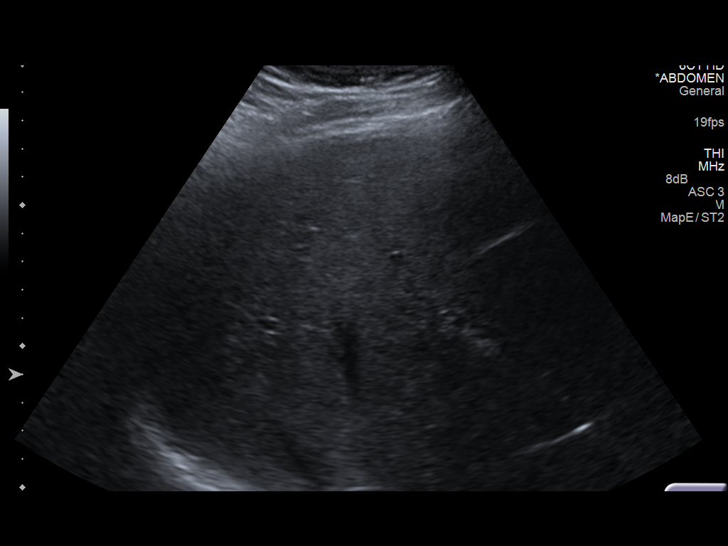
[im 37/59]
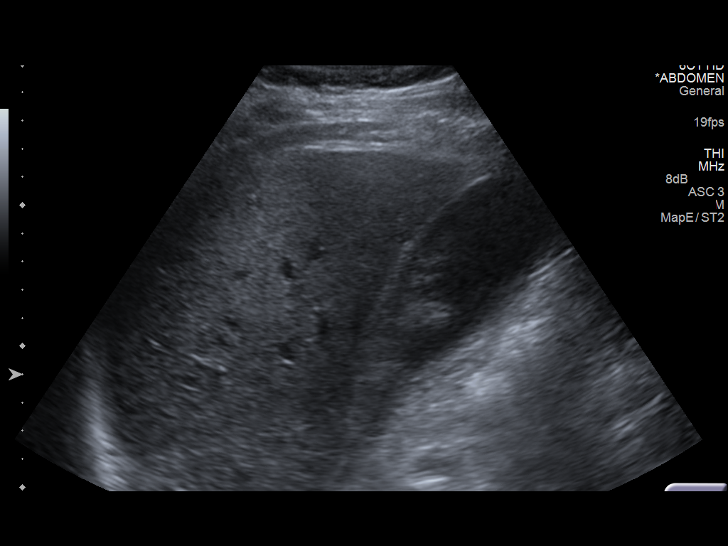
[im 39/59]
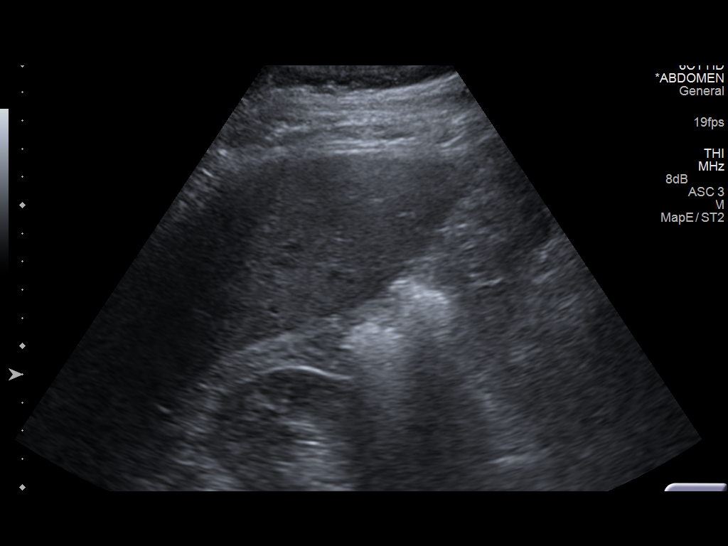
[im 44/59]
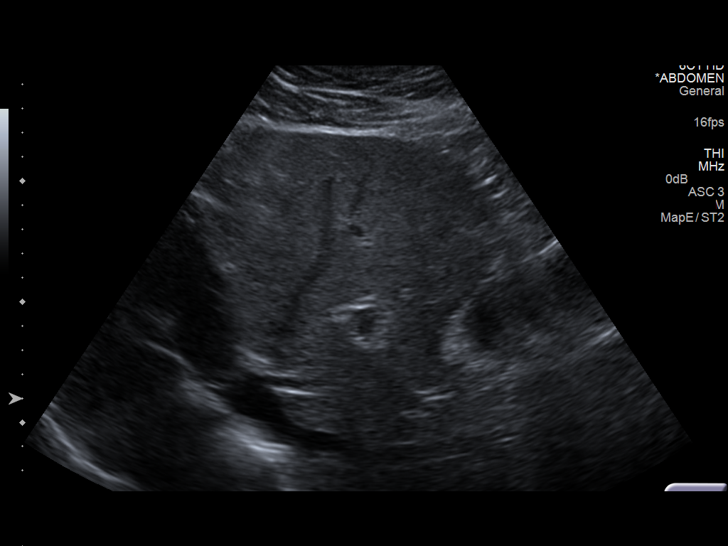
[im 49/59]
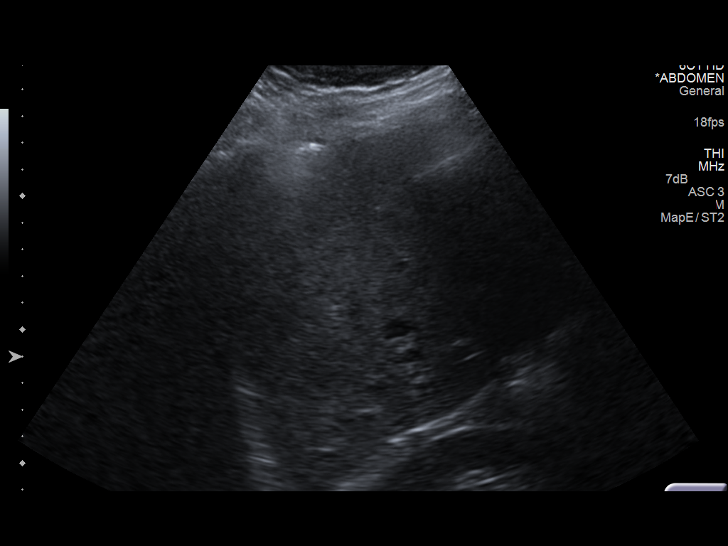
[im 54/59]
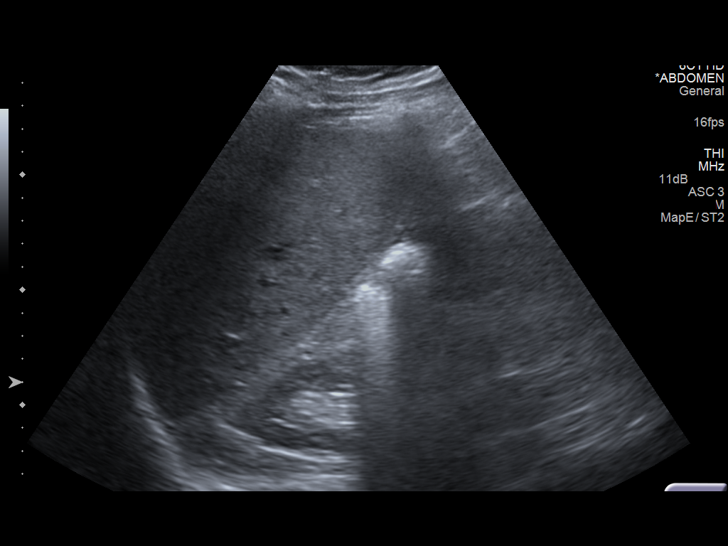
[im 59/59]
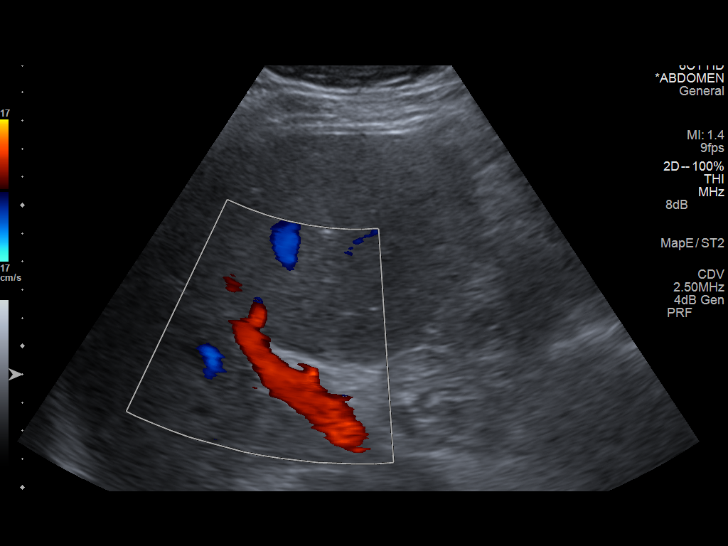

[14 of 25 positions shown; findings below may reference images not displayed]

FINDINGS: Gallbladder:

Multiple mobile gallstones, with a non mobile gallstone in the
gallbladder neck. The gallbladder is physiologically distended.
Borderline wall thickness of 3 mm, with more focal thickening of 5
mm about the body/fundus. No sonographic Murphy sign noted.

Common bile duct:

Diameter: 2.8 mm, normal.

Liver:

No focal lesion identified. Within normal limits in parenchymal
echogenicity. Normal directional flow in the main portal vein.
IMPRESSION: 1. Cholelithiasis with a non mobile gallstone in the gallbladder
neck. Borderline wall thickening of 3 mm, with more focal thickening
about the body/fundus. This may reflect acute cholecystitis, however
sonographic Murphy sign is negative. Nuclear medicine hepatobiliary
scan could be considered if there is clinical concern for acute
cholecystitis.
2. No biliary dilatation.

## 2019-07-26 ENCOUNTER — Other Ambulatory Visit: Payer: Self-pay

## 2019-07-26 ENCOUNTER — Emergency Department (HOSPITAL_COMMUNITY)
Admission: EM | Admit: 2019-07-26 | Discharge: 2019-07-27 | Disposition: A | Discharge status: Home / Self Care | Payer: BLUE CROSS/BLUE SHIELD | Attending: Emergency Medicine | Admitting: Emergency Medicine

## 2019-07-26 ENCOUNTER — Emergency Department (HOSPITAL_COMMUNITY): Payer: BLUE CROSS/BLUE SHIELD

## 2019-07-26 ENCOUNTER — Encounter (HOSPITAL_COMMUNITY): Payer: Self-pay | Admitting: Emergency Medicine

## 2019-07-26 DIAGNOSIS — M79651 Pain in right thigh: Secondary | ICD-10-CM | POA: Diagnosis not present

## 2019-07-26 DIAGNOSIS — R0789 Other chest pain: Secondary | ICD-10-CM | POA: Insufficient documentation

## 2019-07-26 DIAGNOSIS — R079 Chest pain, unspecified: Secondary | ICD-10-CM | POA: Diagnosis present

## 2019-07-26 DIAGNOSIS — F17228 Nicotine dependence, chewing tobacco, with other nicotine-induced disorders: Secondary | ICD-10-CM | POA: Diagnosis not present

## 2019-07-26 LAB — CBC
HCT: 42.5 % (ref 39.0–52.0)
Hemoglobin: 14.3 g/dL (ref 13.0–17.0)
MCH: 31.4 pg (ref 26.0–34.0)
MCHC: 33.6 g/dL (ref 30.0–36.0)
MCV: 93.4 fL (ref 80.0–100.0)
Platelets: 226 10*3/uL (ref 150–400)
RBC: 4.55 MIL/uL (ref 4.22–5.81)
RDW: 12.3 % (ref 11.5–15.5)
WBC: 7.4 10*3/uL (ref 4.0–10.5)
nRBC: 0 % (ref 0.0–0.2)

## 2019-07-26 MED ORDER — SODIUM CHLORIDE 0.9% FLUSH: 3.0000 mL | Freq: Once | INTRAVENOUS | Status: DC

## 2019-07-26 NOTE — ED Triage Notes (Signed)
Pt st;s he was asleep and was woken up with mid chest pain and diaphoresis  Pt st's pain has subsided at this time

## 2019-07-27 LAB — TROPONIN I (HIGH SENSITIVITY)
Troponin I (High Sensitivity): 3 ng/L (ref <18)
Troponin I (High Sensitivity): 3 ng/L (ref <18)

## 2019-07-27 LAB — BASIC METABOLIC PANEL
Anion gap: 14 (ref 5–15)
BUN: 14 mg/dL (ref 6–20)
CO2: 23 mmol/L (ref 22–32)
Calcium: 9.1 mg/dL (ref 8.9–10.3)
Chloride: 101 mmol/L (ref 98–111)
Creatinine, Ser: 1 mg/dL (ref 0.61–1.24)
GFR calc Af Amer: >60 mL/min (ref >60)
GFR calc non Af Amer: >60 mL/min (ref >60)
Glucose, Bld: 96 mg/dL (ref 70–99)
Potassium: 3.5 mmol/L (ref 3.5–5.1)
Sodium: 138 mmol/L (ref 135–145)

## 2019-07-27 NOTE — ED Provider Notes (Signed)
[facility] Provider Note   CSN: [id number] Arrival date & time: 07/26/19  2238     History   Chief Complaint Chief Complaint  Patient presents with  . Chest Pain    HPI Gabriel Carlson is a 48 y.o. male with h/o acid reflux, cholecystectomy, OSA non compliant with CPAP presents to ER for evaluation of chest pain. First noticed 2 weeks ago.  Localized to left sided of chest, non radiating described as "stinging" "burning".  He has similar "stinging" and "burning" discomfort in his right lateral thigh that sometimes comes and goes independently of the chest discomfort.  Initially the discomfort was more intense but over the last 2 weeks it has eased up in severity.  CP usually occurs in the middle of the night, wakes up with the pain.  CP can be fleeting lasting only a few seconds up to 30 mins.  Sometimes it happens after eating.  He has had indigestion in the past but states indigestion previously was never just on one side of his chest.  One time he had sweats before going to bed, and woke up later in the night with chest pain.  Pain feels similar to gallbladder attacks he had years ago when he had cholecystectomy.  CP is never exertional or pleuritic. States he is active and works outside and has never experience CP or Sob during activity.  He has taken aspirin which provides mild relief.    No associated palpitations, SOB, syncope, PND, orthopnea, edema.  Denies associated fever, cough, nausea, vomiting, abdominal pain. No loss of sensation or weakness to extremities.   No personal h/o CAD. No immediate family h/o CAD at a young age, grandfather had heart disease before 5450s.  No personal h/o HTN, HLD, DM, smoking. Recently lose 25 pounds.  No h/o PE/DVT, recent surgery, prolonged immobilization, calf pain, hemoptysis, cancer h/o, hormone use.      HPI  Past Medical History:  Diagnosis Date  . Gout   . PONV (postoperative nausea and vomiting)    . Sleep apnea    dones not use cpap    There are no active problems to display for this patient.   Past Surgical History:  Procedure Laterality Date  . CHOLECYSTECTOMY N/A 07/10/2015   Procedure: LAPAROSCOPIC CHOLECYSTECTOMY ;  Surgeon: Jimmye NormanJames Wyatt, MD;  [facility] OR;  Service: General;  Laterality: N/A;  . EYE SURGERY     prostetic eye  . KNEE SURGERY    . TONSILLECTOMY          Home Medications    Prior to Admission medications   Medication Sig Start Date End Date Taking? Authorizing Provider  HYDROcodone-acetaminophen (NORCO) 10-325 MG per tablet Take 1 tablet by mouth every 6 (six) hours as needed for moderate pain.    [provider]  HYDROcodone-acetaminophen (NORCO/VICODIN) 5-325 MG per tablet Take 1-2 tablets by mouth every 4 (four) hours as needed for moderate pain or severe pain. 07/10/15   Jimmye NormanWyatt, James, MD  omeprazole (PRILOSEC OTC) 20 MG tablet Take 20 mg by mouth daily as needed (heartburn or indigestion).     [provider]  ondansetron (ZOFRAN-ODT) 4 MG disintegrating tablet Take 4 mg by mouth every 8 (eight) hours as needed for nausea or vomiting.    [provider]  scopolamine (TRANSDERM-SCOP) 1 MG/3DAYS Place 1 patch (1.5 mg total) onto the skin every 3 (three) days. 07/10/15   Jimmye NormanWyatt, James, MD    Family History No  family history on file.  Social History Social History   Tobacco Use  . Smoking status: Never Smoker  . Smokeless tobacco: Current User    Types: Snuff  Substance Use Topics  . Alcohol use: No  . Drug use: No     Allergies   Patient has no known allergies.   Review of Systems Review of Systems  Cardiovascular: Positive for chest pain.  All other systems reviewed and are negative.    Physical Exam Updated Vital Signs BP 102/69   Pulse (!) 52   Temp 97.9 F (36.6 C) (Oral)   Resp 17   Ht 5\' 11"  (1.803 m)   Wt 104.3 kg   SpO2 98%   BMI 32.08 kg/m   Physical Exam Constitutional:       Appearance: He is well-developed.     Comments: NAD. Non toxic.   HENT:     Head: Normocephalic and atraumatic.     Nose: Nose normal.  Eyes:     General: Lids are normal.     Conjunctiva/sclera: Conjunctivae normal.  Neck:     Musculoskeletal: Normal range of motion.     Trachea: Trachea normal.     Comments: Trachea midline.  Cardiovascular:     Rate and Rhythm: Normal rate and regular rhythm.     Pulses:          Radial pulses are 1+ on the right side and 1+ on the left side.       Dorsalis pedis pulses are 1+ on the right side and 1+ on the left side.     Heart sounds: Normal heart sounds, S1 normal and S2 normal.     Comments: No LE edema or calf tenderness.  Pulmonary:     Effort: Pulmonary effort is normal.     Breath sounds: Normal breath sounds.  Chest:     Comments: No reproducible chest wall tenderness  Abdominal:     General: Bowel sounds are normal.     Palpations: Abdomen is soft.     Tenderness: There is no abdominal tenderness.     Comments: No epigastric tenderness. No distention.   Skin:    General: Skin is warm and dry.     Capillary Refill: Capillary refill takes less than 2 seconds.     Comments: No rash to chest wall  Neurological:     Mental Status: He is alert.     GCS: GCS eye subscore is 4. GCS verbal subscore is 5. GCS motor subscore is 6.     Comments: Sensation and strength intact in bilateral UE and LE   Psychiatric:        Speech: Speech normal.        Behavior: Behavior normal.        Thought Content: Thought content normal.      ED Treatments / Results  Labs (all labs ordered are listed, but only abnormal results are displayed) Labs Reviewed  BASIC METABOLIC PANEL  CBC  TROPONIN I (HIGH SENSITIVITY)  TROPONIN I (HIGH SENSITIVITY)    EKG EKG Interpretation  Date/Time:  Saturday July 27 2019 02:16:26 EDT Ventricular Rate:  51 PR Interval:  164 QRS Duration: 103 QT Interval:  457 QTC Calculation: 421 R Axis:   54 Text  Interpretation:  Sinus rhythm No significant change since last tracing Confirmed by Ripley Fraise [phone]) on 07/27/2019 2:47:46 AM   Radiology Dg Chest 2 View  Result Date: 07/26/2019 CLINICAL DATA:  Chest pain EXAM: CHEST -  2 VIEW COMPARISON:  None. FINDINGS: The heart size and mediastinal contours are within normal limits. Both lungs are clear. The visualized skeletal structures are unremarkable. IMPRESSION: No active cardiopulmonary disease. Electronically Signed   By: Jasmine Pang M.D.   On: 07/26/2019 23:14    Procedures Procedures (including critical care time)  Medications Ordered in ED Medications  sodium chloride flush (NS) 0.9 % injection 3 mL (has no administration in time range)     Initial Impression / Assessment and Plan / ED Course  I have reviewed the triage vital signs and the nursing notes.  Pertinent labs & imaging results that were available during my care of the patient were reviewed by me and considered in my medical decision making (see chart for details).     Pt is a 48 y.o. male presents with what sounds like atypical CP. Last CP en route to ER, but CP free in ER.  Symptoms described as burning and stinging. He also has similar discomfort in right lateral leg.  CP occurs while sleeping/rest. He is active and never has CP/SOB on exertion. CP is non exertional, non pleuritic, non positional. No associated concerning features such as fever, cough, SOB. No recent illnesses. No palpitations, light-headedness, syncope, pleuritic pain, leg swelling/calf pain to suggest DVT.  He has no personal cardiac risk factors includingHTN, hypercholesterolemia, DM, obesity, smoking, known CAD.  Grandfather has early onset CAD but no first degree relatives.   VS WNL and stable. CV and pulmonary exam benign. No LE edema or calf tenderness. No epigastric tenderness. No neuro or pulse deficits.   Work up benign. EKG non-ischemic.  Hs-trop 3 > 3. No risk factors for PE/DVT. PERC  negative. HEART score <3.   Given symptomatology, exam, non ischemic cardiac work up in ER and HEART score patient is appropriate for discharge with PCP f/u.  He has PCP physical appointment on Monday. CP sometimes occurs after eating and at night time, could be GERD. Recommended diet changes and omeprazole to see if this helps. Work up not suggestive of symptomatic anemia, PE, PTX, dissection, ACS.  Likely atypical chest pain, possibly MSK etiology vs pleurisy vs costochondritis vs GI related vs other. ED return preacutions given. Pt appears reliable for follow up, aware of symptoms that would warrant return to ER.  Pt is comfortable and agreeable with ER POC and discharge plan.   Final Clinical Impressions(s) / ED Diagnoses   Final diagnoses:  Atypical chest pain    ED Discharge Orders    None       Jerrell Mylar 07/27/19 [id number]    Zadie Rhine, MD 07/27/19 [phone]

## 2019-07-27 NOTE — Discharge Instructions (Signed)
You were evaluated in the emergency department for chest pain.    Your cardiac work up today was normal.   Cause of your pain is unclear. It could be related to acid reflux. Monitor for symptoms after eating.  You may start daily omeprazole 40 mg every morning on an empty stomach to see if this helps. Avoid big meals before going to bed.   Based on your risk factors, work up and exam you are considered low risk for major adverse cardiac events in the next 30 days.  This means you can be discharged with close follow up with primary care doctor follow up for further discussion of your symptoms, you may be referred to cardiology.   Please return to ED if: Your chest pain is worse or on exertion You have a cough that gets worse, or you cough up blood. You have severe pain in chest, back or abdomen. You have chest pain or shortness of breath with exertion or activity You have sudden, unexplained discomfort in your chest, with radiation arms, back, neck, or jaw. You suddenly have chest pain and begin to sweat, or your skin gets clammy. You feel chest pain with nausea or vomiting. You suddenly feel light-headed or faint. Your heart begins to beat quickly, or it feels like it is skipping beats. You have one sided leg swelling or calf pain

## 2020-04-02 IMAGING — DX CHEST - 2 VIEW
2 series · 2 of 2 positions shown · non-contrast
Comparison: None.

CLINICAL DATA: Chest pain

EXAM:
CHEST - 2 VIEW

[chest pa]
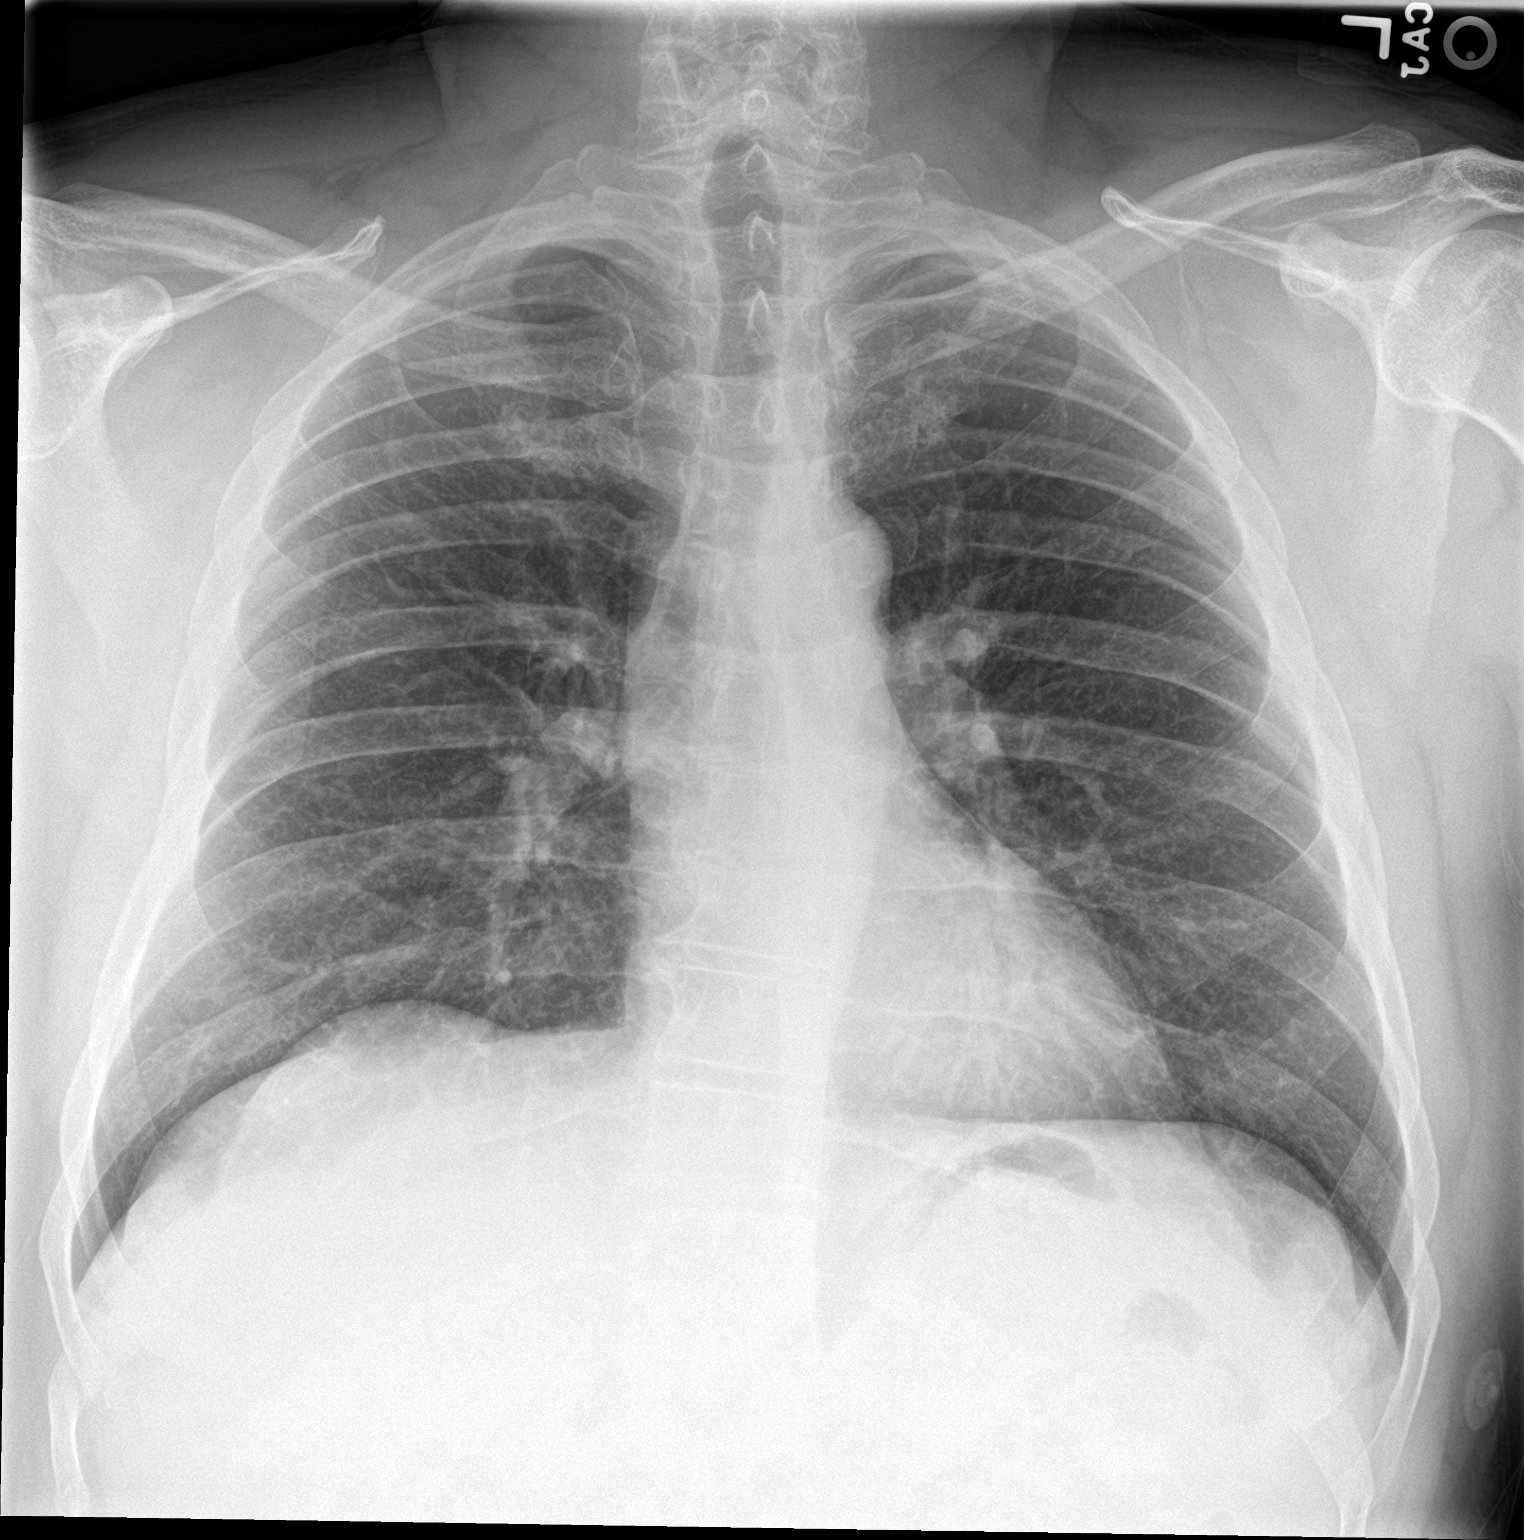

[chest lat]
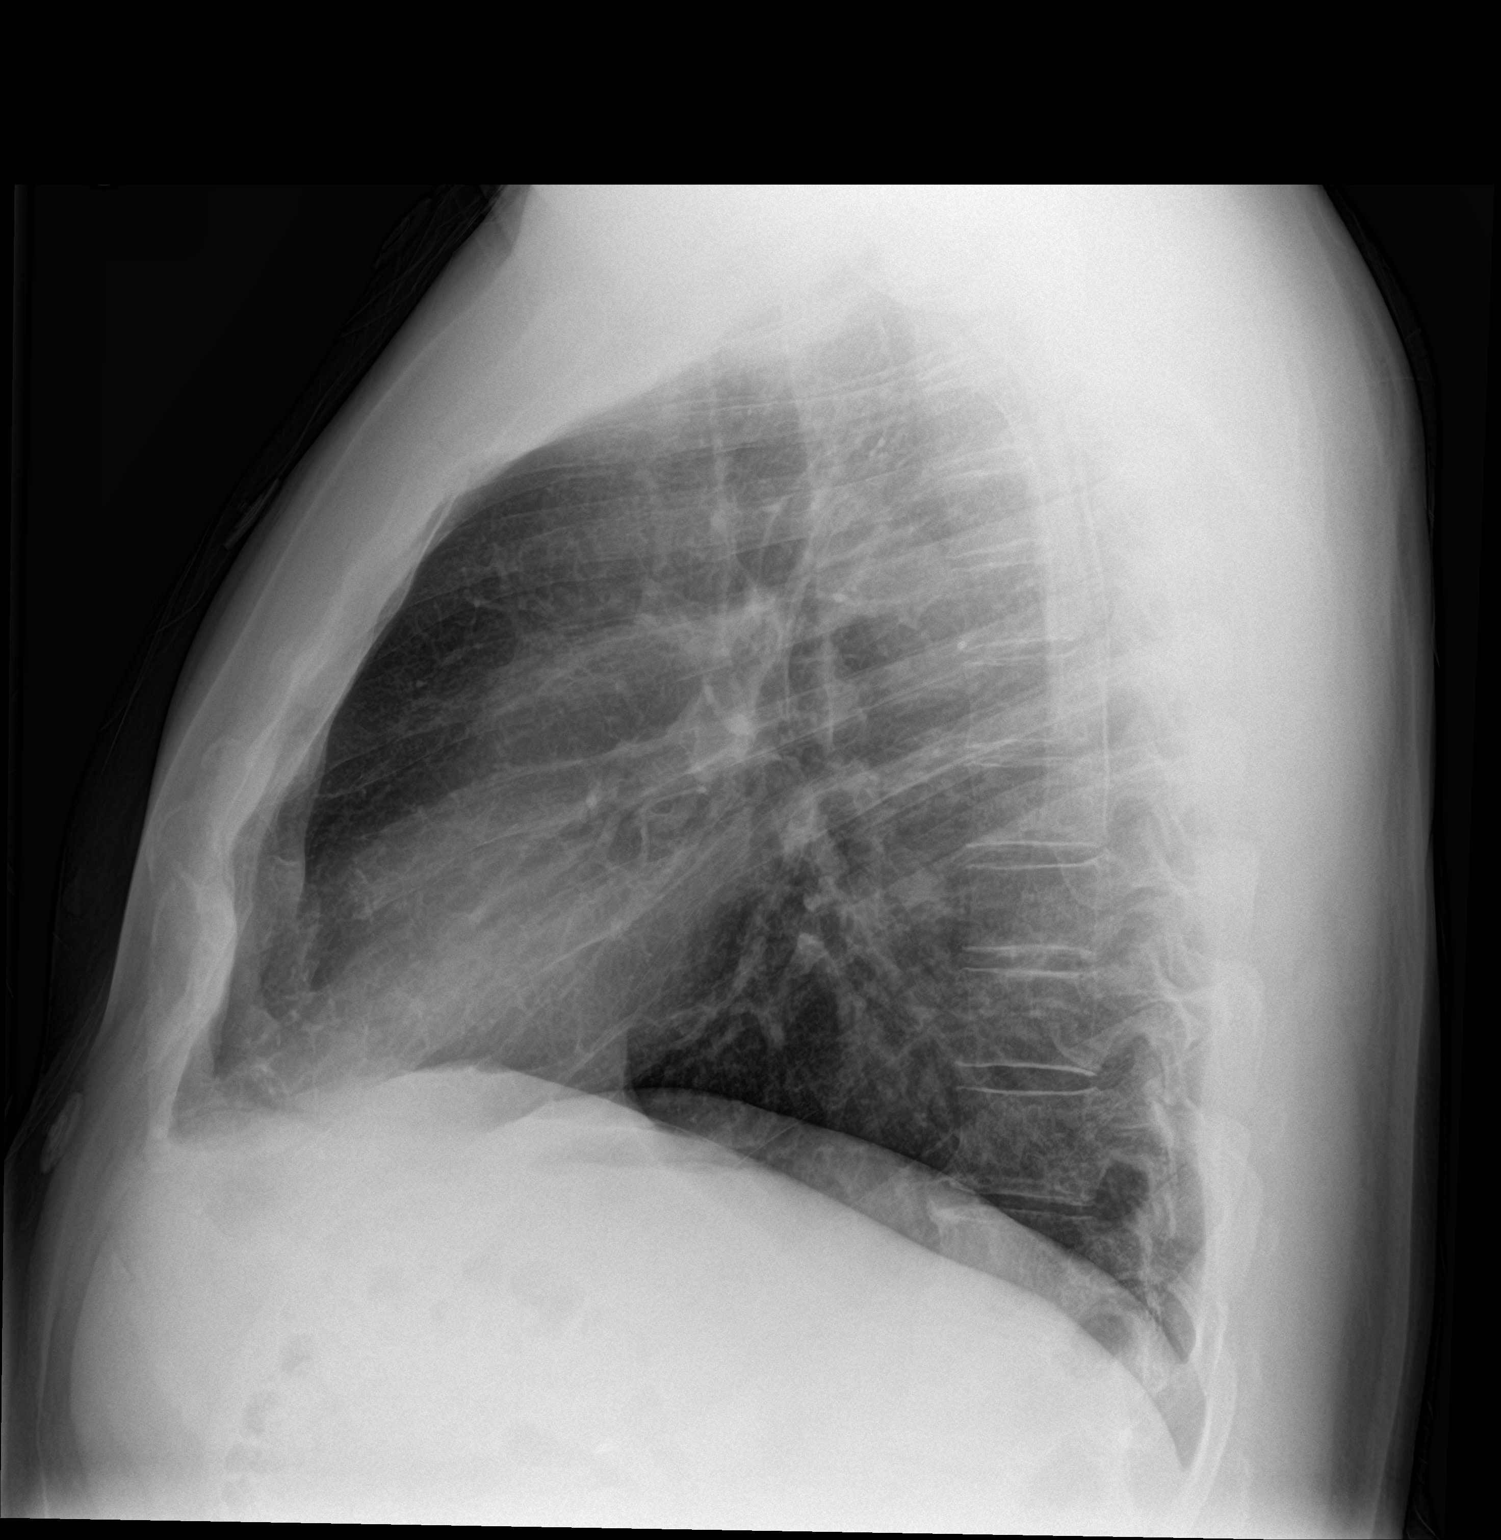

[2 of 2 positions shown; findings below may reference images not displayed]

FINDINGS: The heart size and mediastinal contours are within normal limits.
Both lungs are clear. The visualized skeletal structures are
unremarkable.
IMPRESSION: No active cardiopulmonary disease.
